# Patient Record
Sex: Male | Born: 1980 | Hispanic: No | Marital: Married | Smoking: Never smoker
Health system: Southern US, Community
[De-identification: ages and names within clinical notes are randomized; demographics above are authoritative.]

---

## 2000-06-14 ENCOUNTER — Encounter: Payer: Self-pay | Admitting: Emergency Medicine

## 2000-06-14 ENCOUNTER — Emergency Department (HOSPITAL_COMMUNITY): Admission: EM | Admit: 2000-06-14 | Discharge: 2000-06-14 | Payer: Self-pay | Admitting: Emergency Medicine

## 2005-05-29 ENCOUNTER — Encounter: Admission: RE | Admit: 2005-05-29 | Discharge: 2005-05-29 | Payer: Self-pay | Admitting: Family Medicine

## 2013-09-02 ENCOUNTER — Emergency Department (HOSPITAL_BASED_OUTPATIENT_CLINIC_OR_DEPARTMENT_OTHER)
Admission: EM | Admit: 2013-09-02 | Discharge: 2013-09-02 | Disposition: A | Discharge status: Home / Self Care | Payer: BC Managed Care – PPO | Attending: Emergency Medicine | Admitting: Emergency Medicine

## 2013-09-02 ENCOUNTER — Encounter (HOSPITAL_BASED_OUTPATIENT_CLINIC_OR_DEPARTMENT_OTHER): Payer: Self-pay | Admitting: Emergency Medicine

## 2013-09-02 DIAGNOSIS — R5381 Other malaise: Secondary | ICD-10-CM | POA: Insufficient documentation

## 2013-09-02 DIAGNOSIS — R5383 Other fatigue: Secondary | ICD-10-CM

## 2013-09-02 DIAGNOSIS — R0602 Shortness of breath: Secondary | ICD-10-CM | POA: Insufficient documentation

## 2013-09-02 DIAGNOSIS — R51 Headache: Secondary | ICD-10-CM | POA: Insufficient documentation

## 2013-09-02 LAB — COMPREHENSIVE METABOLIC PANEL
ALT: 21 U/L (ref 0–53)
AST: 23 U/L (ref 0–37)
Albumin: 4.3 g/dL (ref 3.5–5.2)
Alkaline Phosphatase: 55 U/L (ref 39–117)
BUN: 15 mg/dL (ref 6–23)
CO2: 24 mEq/L (ref 19–32)
Calcium: 9.5 mg/dL (ref 8.4–10.5)
Chloride: 103 mEq/L (ref 96–112)
Creatinine, Ser: 1.1 mg/dL (ref 0.50–1.35)
GFR calc Af Amer: >90 mL/min (ref >90)
GFR calc non Af Amer: 86 mL/min — ABNORMAL LOW (ref >90)
Glucose, Bld: 100 mg/dL — ABNORMAL HIGH (ref 70–99)
Potassium: 4 mEq/L (ref 3.7–5.3)
Sodium: 141 mEq/L (ref 137–147)
Total Bilirubin: 1 mg/dL (ref 0.3–1.2)
Total Protein: 7.1 g/dL (ref 6.0–8.3)

## 2013-09-02 LAB — CBC WITH DIFFERENTIAL/PLATELET
Basophils Absolute: 0.1 10*3/uL (ref 0.0–0.1)
Basophils Relative: 1 % (ref 0–1)
Eosinophils Absolute: 0.3 10*3/uL (ref 0.0–0.7)
Eosinophils Relative: 5 % (ref 0–5)
HCT: 43.4 % (ref 39.0–52.0)
Hemoglobin: 15.3 g/dL (ref 13.0–17.0)
Lymphocytes Relative: 29 % (ref 12–46)
Lymphs Abs: 1.6 10*3/uL (ref 0.7–4.0)
MCH: 29.9 pg (ref 26.0–34.0)
MCHC: 35.3 g/dL (ref 30.0–36.0)
MCV: 84.8 fL (ref 78.0–100.0)
Monocytes Absolute: 0.5 10*3/uL (ref 0.1–1.0)
Monocytes Relative: 9 % (ref 3–12)
Neutro Abs: 3.2 10*3/uL (ref 1.7–7.7)
Neutrophils Relative %: 57 % (ref 43–77)
Platelets: 184 10*3/uL (ref 150–400)
RBC: 5.12 MIL/uL (ref 4.22–5.81)
RDW: 13.4 % (ref 11.5–15.5)
WBC: 5.6 10*3/uL (ref 4.0–10.5)

## 2013-09-02 LAB — TSH: TSH: 2.4 u[IU]/mL (ref 0.350–4.500)

## 2013-09-02 LAB — TROPONIN I: Troponin I: <0.3 ng/mL (ref <0.30)

## 2013-09-02 MED ORDER — DOXYCYCLINE HYCLATE 100 MG PO CAPS: 100.0000 mg | ORAL_CAPSULE | Freq: Two times a day (BID) | ORAL | Status: DC

## 2013-09-02 NOTE — ED Notes (Signed)
Pt c/o of nausea since yesterday with generalized fatigue. Pt reports finding tick on him last thurs.

## 2013-09-02 NOTE — ED Provider Notes (Signed)
CSN: [id number]     Arrival date & time 09/02/13  1903 History  This chart was scribed for Perry Lyonsouglas Delo, MD by Nicholos Johnsenise Iheanachor, ED scribe. This patient was seen in room MH06/MH06 and the patient's care was started at 7:49 PM.      Chief Complaint  Patient presents with  . Nausea   The history is provided by the patient. No language interpreter was used.   HPI Comments: Perry Park is a 33 y.o. male who presents to the Emergency Department complaining of nausea; onset 2 days ago. Sensation to vomit with heavy exertion. States he works out daily but was unable to yesterday due to nausea. Workouts consist of heavy cardio. Also reports some generalized weakness and fatigue, mild HA, intermittent chills, and SOB. Reports pulling a tick off of him 6 days ago. [place] and states he could have got it while walking his dog. 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 Denies fever, rash, chest pain, cough, diarrhea, or swollen lymph nodes.  History reviewed. No pertinent past medical history. History reviewed. No pertinent past surgical history. No family history on file. History  Substance Use Topics  . Smoking status: Never Smoker   . Smokeless tobacco: Not on file  . Alcohol Use: Yes    Review of Systems  Constitutional: Positive for fatigue. Negative for fever and chills.  Respiratory: Positive for shortness of breath. Negative for cough.   Cardiovascular: Negative for chest pain.  Gastrointestinal: Negative for diarrhea.  Skin: Negative for rash.  Neurological: Positive for weakness and headaches.  All other systems reviewed and are negative.  Allergies  Review of patient's allergies indicates no known allergies.  Home Medications   Prior to Admission medications   Not on File   Triage vitals: BP 113/70  Pulse 74  Temp(Src) 98.1 F (36.7 C) (Oral)  Resp 18  Ht 5\' 10"  (1.778 m)  Wt 155 lb (70.308 kg)  BMI 22.24 kg/m2  SpO2 100%  Physical Exam   Nursing note and vitals reviewed. Constitutional: He is oriented to person, place, and time. He appears well-developed and well-nourished. No distress.  HENT:  Head: Normocephalic and atraumatic.  Mouth/Throat: Oropharynx is clear and moist.  Eyes: Conjunctivae and EOM are normal.  Neck: Neck supple. No tracheal deviation present.  Cardiovascular: Normal rate, regular rhythm and normal heart sounds.   Pulmonary/Chest: Effort normal and breath sounds normal. No respiratory distress.  Abdominal: Soft. There is no tenderness.  Musculoskeletal: Normal range of motion.  Lymphadenopathy:    He has no cervical adenopathy.  Neurological: He is alert and oriented to person, place, and time.  Skin: Skin is warm and dry.  Psychiatric: He has a normal mood and affect. His behavior is normal.    ED Course  Procedures (including critical care time) DIAGNOSTIC STUDIES: Oxygen Saturation is 100% on room air, normal by my interpretation.    COORDINATION OF CARE: At 8:01 PM: Discussed treatment plan with patient which includes blood and lab work. Patient agrees.   Labs Review Labs Reviewed - No data to display  Imaging Review No results found.   EKG Interpretation None      MDM   Final diagnoses:  None   Patient presents with weakness starting yesterday morning. He states he was bitten by a tic which he removed 1 week ago. He denies any fevers or chills. He has had some headache but denies stiff neck. His physical exam is unremarkable and  he appears clinically well. Laboratory studies are unremarkable. I suspect either a viral etiology, and less likely an illness related to the tick bite. I discussed this with him and have decided I will prescribe doxycycline. He will hold this prescription for the next 24-48 hours. He is not feeling better he will start the medication. He understands to return if his symptoms substantially worsen or change.  I personally performed the services described  in this documentation, which was scribed in my presence. The recorded information has been reviewed and is accurate.     Perry Lyonsouglas Delo, MD 09/02/13 2107

## 2013-09-02 NOTE — Discharge Instructions (Signed)
Drink plenty of fluids and get plenty of rest.  Doxycycline: Start this medication if you become febrile or symptoms do not improve in the next 24-48 hours.  Return to the emergency department if your symptoms substantially worsen or change.   Fatigue Fatigue is a feeling of tiredness, lack of energy, lack of motivation, or feeling tired all the time. Having enough rest, good nutrition, and reducing stress will normally reduce fatigue. Consult your caregiver if it persists. The nature of your fatigue will help your caregiver to find out its cause. The treatment is based on the cause.  CAUSES  There are many causes for fatigue. Most of the time, fatigue can be traced to one or more of your habits or routines. Most causes fit into one or more of three general areas. They are: Lifestyle problems  Sleep disturbances.  Overwork.  Physical exertion.  Unhealthy habits.  Poor eating habits or eating disorders.  Alcohol and/or drug use .  Lack of proper nutrition (malnutrition). Psychological problems  Stress and/or anxiety problems.  Depression.  Grief.  Boredom. Medical Problems or Conditions  Anemia.  Pregnancy.  Thyroid gland problems.  Recovery from major surgery.  Continuous pain.  Emphysema or asthma that is not well controlled  Allergic conditions.  Diabetes.  Infections (such as mononucleosis).  Obesity.  Sleep disorders, such as sleep apnea.  Heart failure or other heart-related problems.  Cancer.  Kidney disease.  Liver disease.  Effects of certain medicines such as antihistamines, cough and cold remedies, prescription pain medicines, heart and blood pressure medicines, drugs used for treatment of cancer, and some antidepressants. SYMPTOMS  The symptoms of fatigue include:   Lack of energy.  Lack of drive (motivation).  Drowsiness.  Feeling of indifference to the surroundings. DIAGNOSIS  The details of how you feel help guide your  caregiver in finding out what is causing the fatigue. You will be asked about your present and past health condition. It is important to review all medicines that you take, including prescription and non-prescription items. A thorough exam will be done. You will be questioned about your feelings, habits, and normal lifestyle. Your caregiver may suggest blood tests, urine tests, or other tests to look for common medical causes of fatigue.  TREATMENT  Fatigue is treated by correcting the underlying cause. For example, if you have continuous pain or depression, treating these causes will improve how you feel. Similarly, adjusting the dose of certain medicines will help in reducing fatigue.  HOME CARE INSTRUCTIONS   Try to get the required amount of good sleep every night.  Eat a healthy and nutritious diet, and drink enough water throughout the day.  Practice ways of relaxing (including yoga or meditation).  Exercise regularly.  Make plans to change situations that cause stress. Act on those plans so that stresses decrease over time. Keep your work and personal routine reasonable.  Avoid street drugs and minimize use of alcohol.  Start taking a daily multivitamin after consulting your caregiver. SEEK MEDICAL CARE IF:   You have persistent tiredness, which cannot be accounted for.  You have fever.  You have unintentional weight loss.  You have headaches.  You have disturbed sleep throughout the night.  You are feeling sad.  You have constipation.  You have dry skin.  You have gained weight.  You are taking any new or different medicines that you suspect are causing fatigue.  You are unable to sleep at night.  You develop any unusual swelling of  your legs or other parts of your body. SEEK IMMEDIATE MEDICAL CARE IF:   You are feeling confused.  Your vision is blurred.  You feel faint or pass out.  You develop severe headache.  You develop severe abdominal, pelvic, or  back pain.  You develop chest pain, shortness of breath, or an irregular or fast heartbeat.  You are unable to pass a normal amount of urine.  You develop abnormal bleeding such as bleeding from the rectum or you vomit blood.  You have thoughts about harming yourself or committing suicide.  You are worried that you might harm someone else. MAKE SURE YOU:   Understand these instructions.  Will watch your condition.  Will get help right away if you are not doing well or get worse. Document Released: 12/31/2006 Document Revised: 05/28/2011 Document Reviewed: 12/31/2006 [facility] Patient Information 2015 [employer] [place]. This information is not intended to replace advice given to you by your health care provider. Make sure you discuss any questions you have with your health care provider.

## 2015-12-12 ENCOUNTER — Emergency Department (HOSPITAL_BASED_OUTPATIENT_CLINIC_OR_DEPARTMENT_OTHER): Payer: BLUE CROSS/BLUE SHIELD

## 2015-12-12 ENCOUNTER — Encounter (HOSPITAL_BASED_OUTPATIENT_CLINIC_OR_DEPARTMENT_OTHER): Payer: Self-pay | Admitting: Emergency Medicine

## 2015-12-12 ENCOUNTER — Emergency Department (HOSPITAL_BASED_OUTPATIENT_CLINIC_OR_DEPARTMENT_OTHER)
Admission: EM | Admit: 2015-12-12 | Discharge: 2015-12-12 | Disposition: A | Discharge status: Home / Self Care | Payer: BLUE CROSS/BLUE SHIELD | Attending: Emergency Medicine | Admitting: Emergency Medicine

## 2015-12-12 DIAGNOSIS — R1031 Right lower quadrant pain: Secondary | ICD-10-CM

## 2015-12-12 LAB — CBC WITH DIFFERENTIAL/PLATELET
Basophils Absolute: 0.1 10*3/uL (ref 0.0–0.1)
Basophils Relative: 1 %
Eosinophils Absolute: 0.2 10*3/uL (ref 0.0–0.7)
Eosinophils Relative: 3 %
HCT: 43.9 % (ref 39.0–52.0)
Hemoglobin: 15.2 g/dL (ref 13.0–17.0)
Lymphocytes Relative: 34 %
Lymphs Abs: 2.3 10*3/uL (ref 0.7–4.0)
MCH: 29.1 pg (ref 26.0–34.0)
MCHC: 34.6 g/dL (ref 30.0–36.0)
MCV: 83.9 fL (ref 78.0–100.0)
Monocytes Absolute: 0.5 10*3/uL (ref 0.1–1.0)
Monocytes Relative: 8 %
Neutro Abs: 3.7 10*3/uL (ref 1.7–7.7)
Neutrophils Relative %: 54 %
Platelets: 211 10*3/uL (ref 150–400)
RBC: 5.23 MIL/uL (ref 4.22–5.81)
RDW: 13.6 % (ref 11.5–15.5)
WBC: 6.7 10*3/uL (ref 4.0–10.5)

## 2015-12-12 LAB — BASIC METABOLIC PANEL
Anion gap: 9 (ref 5–15)
BUN: 16 mg/dL (ref 6–20)
CO2: 25 mmol/L (ref 22–32)
Calcium: 9.5 mg/dL (ref 8.9–10.3)
Chloride: 104 mmol/L (ref 101–111)
Creatinine, Ser: 1.12 mg/dL (ref 0.61–1.24)
GFR calc Af Amer: >60 mL/min (ref >60)
GFR calc non Af Amer: >60 mL/min (ref >60)
Glucose, Bld: 97 mg/dL (ref 65–99)
Potassium: 4 mmol/L (ref 3.5–5.1)
Sodium: 138 mmol/L (ref 135–145)

## 2015-12-12 LAB — URINALYSIS, ROUTINE W REFLEX MICROSCOPIC
BILIRUBIN URINE: NEGATIVE
Glucose, UA: NEGATIVE mg/dL
Hgb urine dipstick: NEGATIVE
KETONES UR: NEGATIVE mg/dL
Leukocytes, UA: NEGATIVE
NITRITE: NEGATIVE
Protein, ur: NEGATIVE mg/dL
Specific Gravity, Urine: 1.009 (ref 1.005–1.030)
pH: 7 (ref 5.0–8.0)

## 2015-12-12 MED ORDER — IOPAMIDOL (ISOVUE-300) INJECTION 61%
100.0000 mL | Freq: Once | INTRAVENOUS | Status: AC | PRN
  Administered 2015-12-12: 100 mL via INTRAVENOUS

## 2015-12-12 MED ORDER — TRAMADOL HCL 50 MG PO TABS: 50.0000 mg | ORAL_TABLET | Freq: Four times a day (QID) | ORAL | 0 refills | Status: DC | PRN

## 2015-12-12 NOTE — ED Provider Notes (Signed)
[facility] Note   CSN: [id number] Arrival date & time: 12/12/15  1747  By signing my name below, I, Perry Park, attest that this documentation has been prepared under the direction and in the presence of Newell RubbermaidJeffrey Jonique Kulig, PA-C. Electronically Signed: Doreatha MartinEva Park, ED Scribe. 12/12/15. 7:52 PM.    History   Chief Complaint Chief Complaint  Patient presents with  . Abdominal Pain    HPI Perry Park is a 35 y.o. male otherwise healthy on no daily medications who presents to the Emergency Department complaining of moderate, gradually worsening RLQ pain onset 3 days ago and worsened today. Pt also complains of dysuria. Pt states his pain is worsened with movement, ambulation. He reports his pain is slightly alleviated with rest. Pt states his pain is similar to muscle soreness after working out, but more severe and long lasting. No h/o similar pain. Patient states his last BM was this morning. He reports he had multiple BMs today, but denies diarrhea. He denies nausea, vomiting, testicular pain, discolored urine, hematuria.   The history is provided by the patient. No language interpreter was used.    History reviewed. No pertinent past medical history.  There are no active problems to display for this patient.   History reviewed. No pertinent surgical history.     Home Medications    Prior to Admission medications   Medication Sig Start Date End Date Taking? Authorizing Provider  traMADol (ULTRAM) 50 MG tablet Take 1 tablet (50 mg total) by mouth every 6 (six) hours as needed. 12/12/15   Eyvonne MechanicJeffrey Elleanor Guyett, PA-C    Family History No family history on file.  Social History Social History  Substance Use Topics  . Smoking status: Never Smoker  . Smokeless tobacco: Never Used  . Alcohol use Yes     Allergies   Review of patient's allergies indicates no known allergies.   Review of Systems Review of Systems  All other systems reviewed and are  negative.    Physical Exam Updated Vital Signs BP 118/82   Pulse 64   Temp 98.6 F (37 C)   Resp 16   Ht 5\' 10"  (1.778 m)   Wt 76.2 kg   SpO2 99%   BMI 24.11 kg/m   Physical Exam  Constitutional: He appears well-developed and well-nourished.  HENT:  Head: Normocephalic.  Eyes: Conjunctivae are normal.  Cardiovascular: Normal rate.   Pulmonary/Chest: Effort normal. No respiratory distress.  Abdominal: Soft. He exhibits no distension. There is tenderness. There is no rebound and no guarding.  Tenderness to RLQ.  No obvious deformities or abnormalities of the abdominal wall  Musculoskeletal: Normal range of motion.  Neurological: He is alert.  Skin: Skin is warm and dry.  No rash  Psychiatric: He has a normal mood and affect. His behavior is normal.  Nursing note and vitals reviewed.    ED Treatments / Results   DIAGNOSTIC STUDIES: Oxygen Saturation is 99% on RA, normal by my interpretation.    COORDINATION OF CARE: 7:50 PM Discussed treatment plan with pt at bedside which includes UA, CT A/P, lab work and pt agreed to plan.    Labs (all labs ordered are listed, but only abnormal results are displayed) Labs Reviewed  URINALYSIS, ROUTINE W REFLEX MICROSCOPIC (NOT [facility])  CBC WITH DIFFERENTIAL/PLATELET  BASIC METABOLIC PANEL    EKG  EKG Interpretation None       Radiology Ct Abdomen Pelvis W Contrast  Result Date: 12/12/2015 CLINICAL DATA:  Right  lower quadrant pain for 3 days.  Dysuria. EXAM: CT ABDOMEN AND PELVIS WITH CONTRAST TECHNIQUE: Multidetector CT imaging of the abdomen and pelvis was performed using the standard protocol following bolus administration of intravenous contrast. CONTRAST:  ISOVUE-300 IOPAMIDOL (ISOVUE-300) INJECTION 61% COMPARISON:  None. FINDINGS: Lower chest: No acute abnormality. Hepatobiliary: No focal liver abnormality is seen. No gallstones, gallbladder wall thickening, or biliary dilatation. Pancreas: Unremarkable. No  pancreatic ductal dilatation or surrounding inflammatory changes. Spleen: Normal in size without focal abnormality. Adrenals/Urinary Tract: Adrenal glands are unremarkable. Cyst is identified arising from the upper pole of the right kidney measuring 2.8 cm. The left kidney is normal. The urinary bladder is negative Stomach/Bowel: The stomach is within normal limits. The small bowel loops have a normal course and caliber. No obstruction. Normal appearance of the colon. The appendix is visualized and appears normal. Vascular/Lymphatic: No significant vascular findings are present. No enlarged abdominal or pelvic lymph nodes. Reproductive: Prostate is unremarkable. Other: No abdominal wall hernia or abnormality. No abdominopelvic ascites. Musculoskeletal: No acute or significant osseous findings. IMPRESSION: 1. No acute findings identified within the abdomen or pelvis. 2. Right kidney cyst. Electronically Signed   By: Signa Kell M.D.   On: 12/12/2015 22:19    Procedures Procedures (including critical care time)  Medications Ordered in ED Medications  iopamidol (ISOVUE-300) 61 % injection 100 mL (100 mLs Intravenous Contrast Given 12/12/15 2125)     Initial Impression / Assessment and Plan / ED Course  I have reviewed the triage vital signs and the nursing notes.  Pertinent labs & imaging results that were available during my care of the patient were reviewed by me and considered in my medical decision making (see chart for details).  Clinical Course    Labs: UA, BMP, CBC  Imaging: CT A/P with contrast   Consults: None   Therapeutics:   Discharge Meds:   Assessment/Plan: This 35-year-old male presents today with right lower quadrant pain.  Patient had normal CT scan, normal laboratory analysis.  He denies any testicular pain, discharge, really concerning signs or symptoms.  Patient's pain likely muscular in nature based on negative findings on today's workup.,  He has a reassuring workup  here, he will need close follow-up with primary care, strict return precautions were given.  Patient verbalizes understanding and agreement did a splint had no further questions or concerns.       Final Clinical Impressions(s) / ED Diagnoses   Final diagnoses:  Right lower quadrant abdominal pain    New Prescriptions Discharge Medication List as of 12/12/2015 11:10 PM    START taking these medications   Details  traMADol (ULTRAM) 50 MG tablet Take 1 tablet (50 mg total) by mouth every 6 (six) hours as needed., Starting Mon 12/12/2015, Print        I personally performed the services described in this documentation, which was scribed in my presence. The recorded information has been reviewed and is accurate.   Eyvonne Mechanic, PA-C 12/13/15 1610    Tomasita Crumble, MD 12/13/15 0930

## 2015-12-12 NOTE — ED Notes (Signed)
Pt c/o RLQ pain that started on Friday, and became acutely worse today. Pt radiates around to back and into groin. Pt reports worsening of pain with urination.

## 2015-12-12 NOTE — ED Notes (Signed)
Patient transported to CT 

## 2015-12-12 NOTE — Discharge Instructions (Signed)
Please use Tylenol or ibuprofen as needed for pain.  Return to emergency room immediately if he expands any new or worsening signs or symptoms.

## 2016-10-29 ENCOUNTER — Encounter (HOSPITAL_BASED_OUTPATIENT_CLINIC_OR_DEPARTMENT_OTHER): Payer: Self-pay | Admitting: *Deleted

## 2016-10-29 ENCOUNTER — Emergency Department (HOSPITAL_BASED_OUTPATIENT_CLINIC_OR_DEPARTMENT_OTHER)
Admission: EM | Admit: 2016-10-29 | Discharge: 2016-10-29 | Disposition: A | Discharge status: Home / Self Care | Payer: BLUE CROSS/BLUE SHIELD | Attending: Emergency Medicine | Admitting: Emergency Medicine

## 2016-10-29 DIAGNOSIS — M545 Low back pain, unspecified: Secondary | ICD-10-CM

## 2016-10-29 MED ORDER — NAPROXEN 250 MG PO TABS: 250.0000 mg | ORAL_TABLET | Freq: Two times a day (BID) | ORAL | 0 refills | Status: DC

## 2016-10-29 MED ORDER — METHOCARBAMOL 500 MG PO TABS: 500.0000 mg | ORAL_TABLET | Freq: Two times a day (BID) | ORAL | 0 refills | Status: DC | PRN
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
## 2018-02-18 IMAGING — CT CT ABD-PELV W/ CM
2 of 4 series · 16 of 46 positions shown, 18 images · IV contrast (APPLIED)
Comparison: None.

CLINICAL DATA: Right lower quadrant pain for 3 days.  Dysuria.

EXAM:
CT ABDOMEN AND PELVIS WITH CONTRAST
TECHNIQUE: Multidetector CT imaging of the abdomen and pelvis was performed
using the standard protocol following bolus administration of
intravenous contrast.
CONTRAST:  100mL S68KHN-RYY IOPAMIDOL (S68KHN-RYY) INJECTION 61%

[Series 2: axial st · axial · 0.78mm/px · z∈[+758,+1194]mm · 13 of 97 slices shown, 15 images]
[im 5/97  soft-tissue]
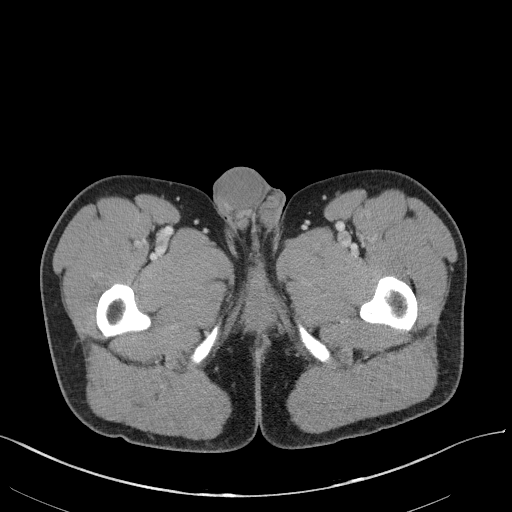
[im 5/97  bone]
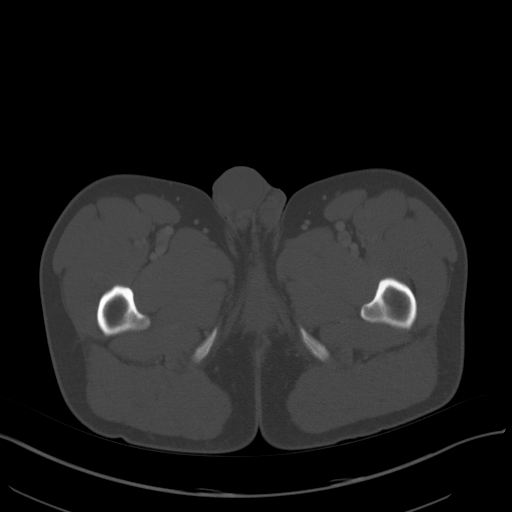
[im 14/97  soft-tissue]
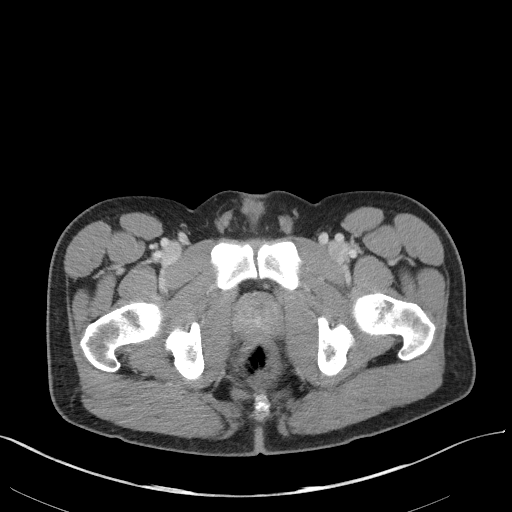
[im 22/97  soft-tissue]
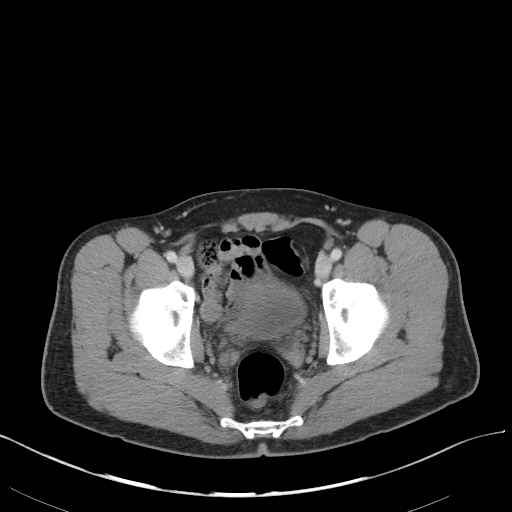
[im 27/97  soft-tissue]
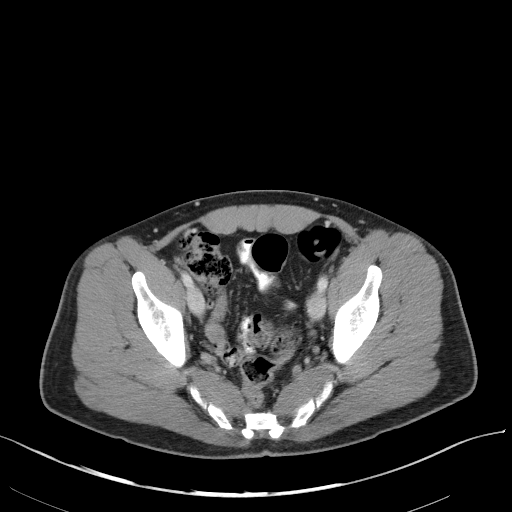
[im 35/97  soft-tissue]
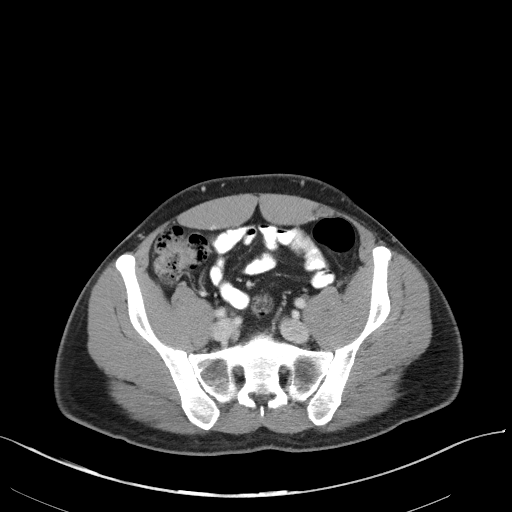
[im 40/97  soft-tissue]
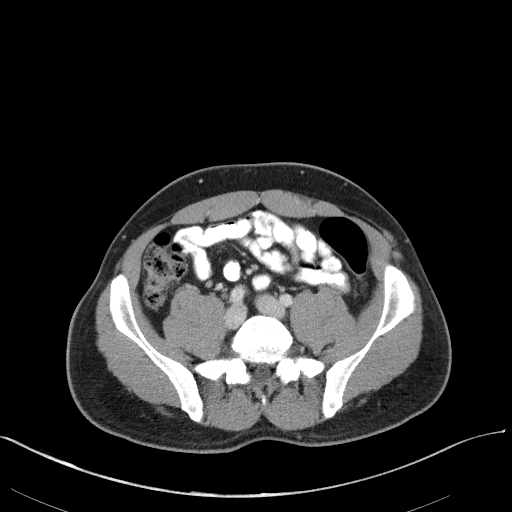
[im 49/97  soft-tissue]
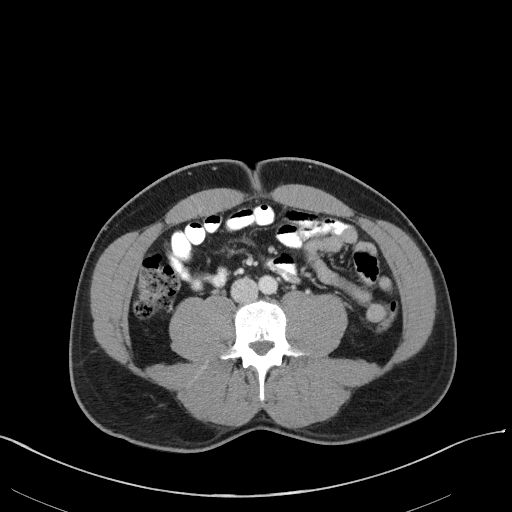
[im 57/97  soft-tissue]
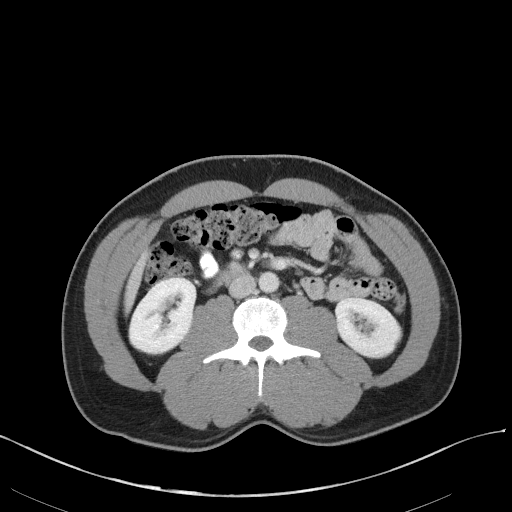
[im 62/97  soft-tissue]
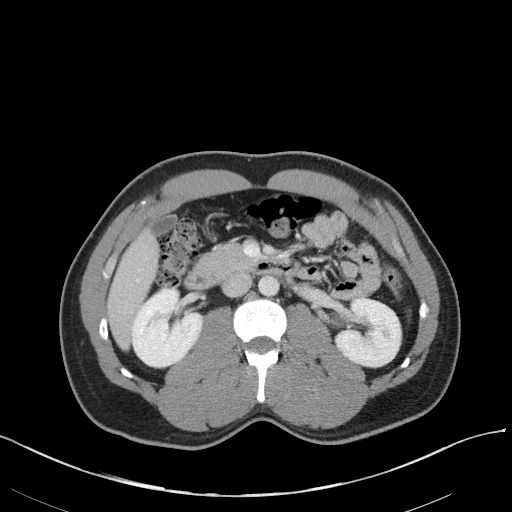
[im 62/97  bone]
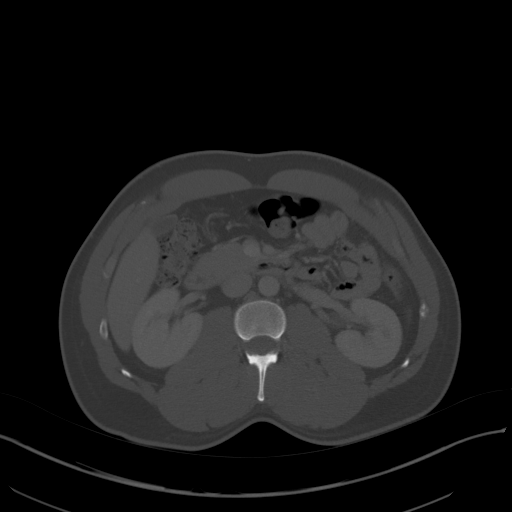
[im 70/97  soft-tissue]
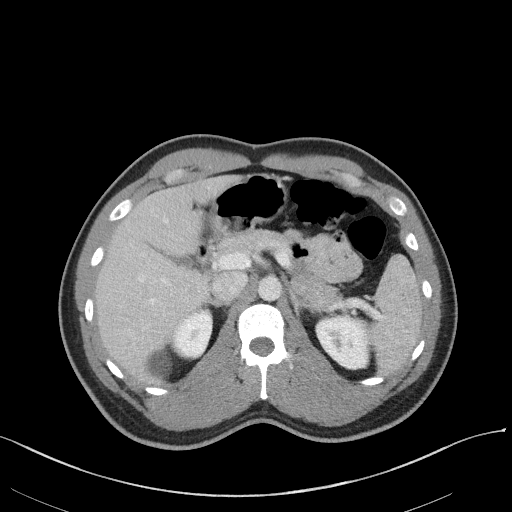
[im 75/97  soft-tissue]
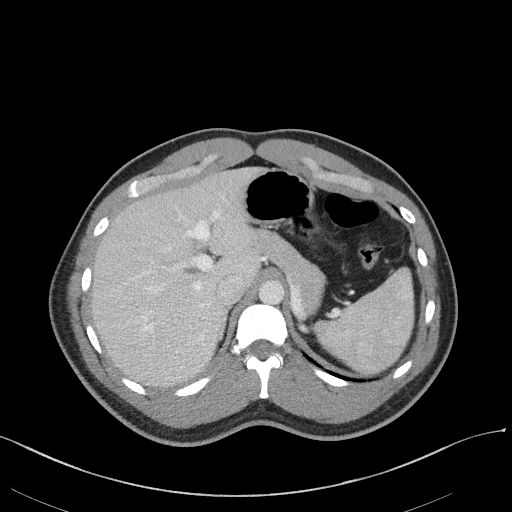
[im 83/97  soft-tissue]
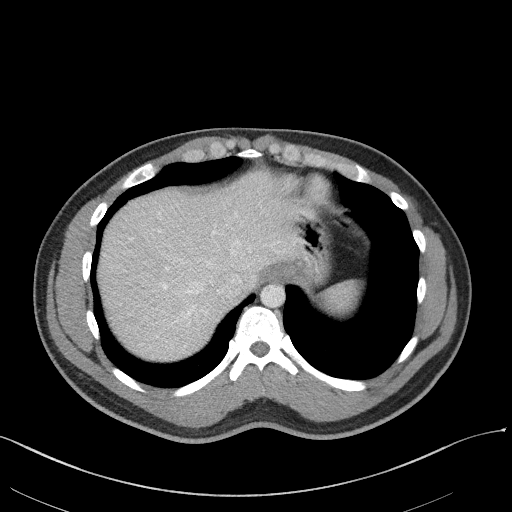
[im 92/97  soft-tissue]
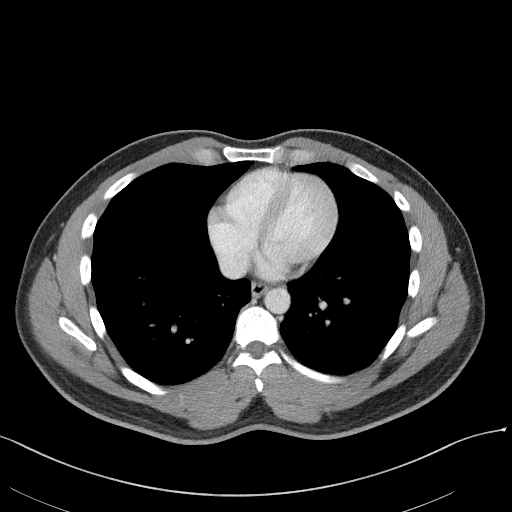

[Series 5: coronal st · coronal · 0.77mm/px · 3 of 76 slices shown]
[im 26/76  soft-tissue]
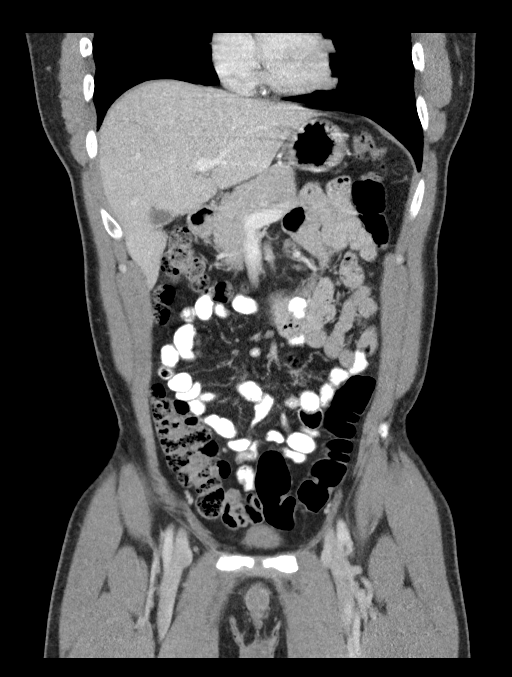
[im 34/76  soft-tissue]
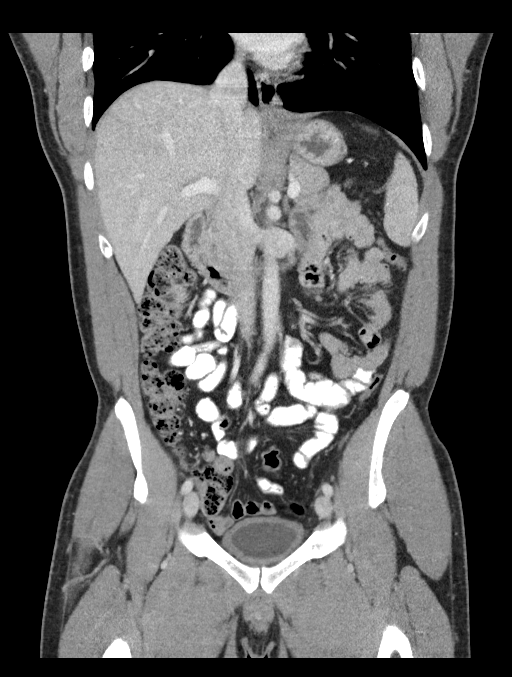
[im 42/76  soft-tissue]
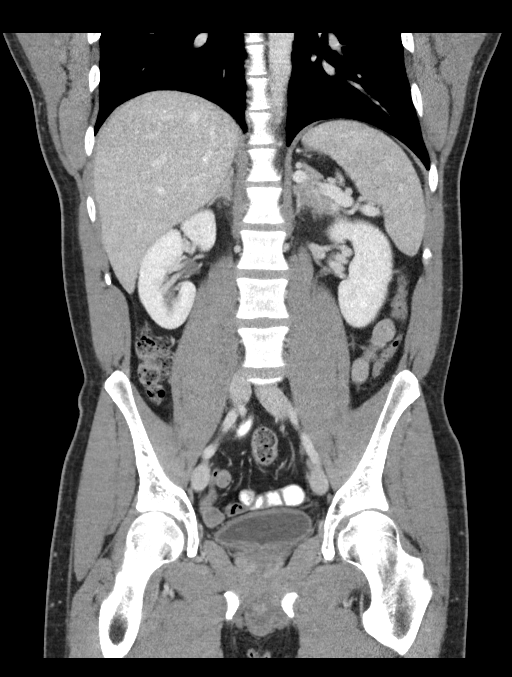

[16 of 46 positions shown; findings below may reference images not displayed]

FINDINGS: Lower chest: No acute abnormality.

Hepatobiliary: No focal liver abnormality is seen. No gallstones,
gallbladder wall thickening, or biliary dilatation.

Pancreas: Unremarkable. No pancreatic ductal dilatation or
surrounding inflammatory changes.

Spleen: Normal in size without focal abnormality.

Adrenals/Urinary Tract: Adrenal glands are unremarkable. Cyst is
identified arising from the upper pole of the right kidney measuring
2.8 cm. The left kidney is normal. The urinary bladder is negative

Stomach/Bowel: The stomach is within normal limits. The small bowel
loops have a normal course and caliber. No obstruction. Normal
appearance of the colon. The appendix is visualized and appears
normal.

Vascular/Lymphatic: No significant vascular findings are present. No
enlarged abdominal or pelvic lymph nodes.

Reproductive: Prostate is unremarkable.

Other: No abdominal wall hernia or abnormality. No abdominopelvic
ascites.

Musculoskeletal: No acute or significant osseous findings.
IMPRESSION: 1. No acute findings identified within the abdomen or pelvis.
2. Right kidney cyst.

## 2019-07-25 ENCOUNTER — Ambulatory Visit: Payer: Self-pay | Attending: Internal Medicine

## 2019-07-25 DIAGNOSIS — Z23 Encounter for immunization: Secondary | ICD-10-CM

## 2019-07-25 NOTE — Progress Notes (Signed)
   Covid-19 Vaccination Clinic  Name:  Perry Park    MRN: [id number] DOB: 1980/06/12  07/25/2019  Perry Park was observed post Covid-19 immunization for 15 minutes without incident. He was provided with Vaccine Information Sheet and instruction to access the V-Safe system.   Perry Park was instructed to call 911 with any severe reactions post vaccine: Marland Kitchen Difficulty breathing  . Swelling of face and throat  . A fast heartbeat  . A bad rash all over body  . Dizziness and weakness   Immunizations Administered    Name Date Dose VIS Date Route   Pfizer COVID-19 Vaccine 07/25/2019 10:10 AM 0.3 mL 05/13/2018 Intramuscular   Manufacturer: [employer], [employer]   Lot: [id number]   NDC: [address]-8

## 2019-08-22 ENCOUNTER — Ambulatory Visit: Payer: Self-pay | Attending: Internal Medicine

## 2019-08-22 DIAGNOSIS — Z23 Encounter for immunization: Secondary | ICD-10-CM

## 2019-08-22 NOTE — Progress Notes (Signed)
° °  Covid-19 Vaccination Clinic  Name:  Perry Park    MRN: [id number] DOB: [birth year]  08/22/2019  [name] was observed post Covid-19 immunization for 15 minutes without incident. He was provided with Vaccine Information Sheet and instruction to access the V-Safe system.   [name] was instructed to call 911 with any severe reactions post vaccine:  Difficulty breathing   Swelling of face and throat   A fast heartbeat   A bad rash all over body   Dizziness and weakness   Immunizations Administered    Name Date Dose VIS Date Route   Pfizer COVID-19 Vaccine 08/22/2019 10:08 AM 0.3 mL 05/13/2018 Intramuscular   Manufacturer: [employer], [employer]   Lot: [id number]   NDC: [address]-6

## 2020-03-14 ENCOUNTER — Encounter (HOSPITAL_BASED_OUTPATIENT_CLINIC_OR_DEPARTMENT_OTHER): Payer: Self-pay

## 2020-03-14 ENCOUNTER — Other Ambulatory Visit: Payer: Self-pay

## 2020-03-14 ENCOUNTER — Emergency Department (HOSPITAL_BASED_OUTPATIENT_CLINIC_OR_DEPARTMENT_OTHER)
Admission: EM | Admit: 2020-03-14 | Discharge: 2020-03-14 | Disposition: A | Discharge status: Home / Self Care | Payer: 59 | Attending: Emergency Medicine | Admitting: Emergency Medicine

## 2020-03-14 DIAGNOSIS — J069 Acute upper respiratory infection, unspecified: Secondary | ICD-10-CM | POA: Insufficient documentation

## 2020-03-14 DIAGNOSIS — Z20822 Contact with and (suspected) exposure to covid-19: Secondary | ICD-10-CM | POA: Insufficient documentation

## 2020-03-14 DIAGNOSIS — R059 Cough, unspecified: Secondary | ICD-10-CM | POA: Diagnosis present

## 2020-03-14 LAB — RESP PANEL BY RT-PCR (FLU A&B, COVID) ARPGX2
Influenza A by PCR: NEGATIVE
Influenza B by PCR: NEGATIVE
SARS Coronavirus 2 by RT PCR: NEGATIVE

## 2020-03-14 LAB — GROUP A STREP BY PCR: Group A Strep by PCR: NOT DETECTED

## 2020-03-14 MED ORDER — BENZONATATE 100 MG PO CAPS: 100.0000 mg | ORAL_CAPSULE | Freq: Three times a day (TID) | ORAL | 0 refills | Status: AC

## 2020-03-14 NOTE — ED Notes (Signed)
ED Provider at bedside. 

## 2020-03-14 NOTE — ED Provider Notes (Signed)
[facility] EMERGENCY DEPARTMENT Provider Note   CSN: [id number] Arrival date & time: 03/14/20  0915     History Chief Complaint  Patient presents with  . Sore Throat    Perry Park is a 39 y.o. male reports is otherwise healthy no daily medication use.  Patient reports 2 days ago he developed rhinorrhea, congestion, mild sore throat and nonproductive cough.  He reports multiple coworkers are sick with similar symptoms but none have tested positive for COVID-19.  Patient reports he is vaccinated against Covid.  He describes sore throat as a mild scratchy sensation bilateral no aggravating or alleviating factors no radiation of pain.  Denies measured fever, denies chest pain/shortness of breath, hemoptysis, abdominal pain, vomiting, diarrhea, extremity swelling/color change, neck stiffness, change to voice, difficulty swallowing or any additional concerns.  HPI     History reviewed. No pertinent past medical history.  There are no problems to display for this patient.   History reviewed. No pertinent surgical history.     No family history on file.  Social History   Tobacco Use  . Smoking status: Never Smoker  . Smokeless tobacco: Never Used  Substance Use Topics  . Alcohol use: Yes  . Drug use: No    Home Medications Prior to Admission medications   Medication Sig Start Date End Date Taking? Authorizing Provider  benzonatate (TESSALON) 100 MG capsule Take 1 capsule (100 mg total) by mouth every 8 (eight) hours. 03/14/20  Yes Harlene Salts A, PA-C    Allergies    Patient has no known allergies.  Review of Systems   Review of Systems  Constitutional: Negative.  Negative for chills and fever.  HENT: Positive for rhinorrhea and sore throat. Negative for facial swelling, trouble swallowing and voice change.   Eyes: Negative.  Negative for visual disturbance.  Respiratory: Positive for cough. Negative for shortness of breath.   Cardiovascular:  Negative.  Negative for chest pain and leg swelling.  Gastrointestinal: Negative.  Negative for abdominal pain, diarrhea and vomiting.  Musculoskeletal: Positive for myalgias. Negative for neck stiffness.  Neurological: Negative.  Negative for weakness and headaches.    Physical Exam Updated Vital Signs BP 131/83 (BP Location: Left Arm)   Pulse 94   Temp 98.2 F (36.8 C) (Oral) Comment: pt did take 400 mg ibuprofen PTA.  Resp 18   Ht 5\' 10"  (1.778 m)   Wt 77.1 kg   SpO2 100%   BMI 24.39 kg/m   Physical Exam Constitutional:      General: He is not in acute distress.    Appearance: Normal appearance. He is well-developed. He is not ill-appearing or diaphoretic.  HENT:     Head: Normocephalic and atraumatic.     Jaw: There is normal jaw occlusion.     Comments: Mild posterior pharynx cobblestoning and postnasal drip. The patient has normal phonation and is in control of secretions. No stridor.  Midline uvula without edema. Soft palate rises symmetrically. No tonsillar erythema, swelling or exudates. Tongue protrusion is normal, floor of mouth is soft. No trismus. No creptius on neck palpation. No gingival erythema or fluctuance noted. Mucus membranes moist. No pallor noted.    Right Ear: Tympanic membrane and ear canal normal.     Left Ear: Tympanic membrane and ear canal normal.  Eyes:     General: Vision grossly intact. Gaze aligned appropriately.     Pupils: Pupils are equal, round, and reactive to light.  Neck:     Trachea:  Trachea and phonation normal. No tracheal tenderness or tracheal deviation.     Meningeal: Brudzinski's sign absent.  Cardiovascular:     Rate and Rhythm: Normal rate and regular rhythm.  Pulmonary:     Effort: Pulmonary effort is normal. No respiratory distress.     Breath sounds: Normal breath sounds.  Abdominal:     General: There is no distension.     Palpations: Abdomen is soft.     Tenderness: There is no abdominal tenderness. There is no  guarding or rebound.  Musculoskeletal:        General: Normal range of motion.     Cervical back: Normal range of motion and neck supple. No edema, rigidity or crepitus.     Right lower leg: No edema.     Left lower leg: No edema.  Skin:    General: Skin is warm and dry.  Neurological:     Mental Status: He is alert.     GCS: GCS eye subscore is 4. GCS verbal subscore is 5. GCS motor subscore is 6.     Comments: Speech is clear and goal oriented, follows commands Major Cranial nerves without deficit, no facial droop Moves extremities without ataxia, coordination intact  Psychiatric:        Behavior: Behavior normal.     ED Results / Procedures / Treatments   Labs (all labs ordered are listed, but only abnormal results are displayed) Labs Reviewed  RESP PANEL BY RT-PCR (FLU A&B, COVID) [id number]  GROUP A STREP BY PCR    EKG None  Radiology No results found.  Procedures Procedures (including critical care time)  Medications Ordered in ED Medications - No data to display  ED Course  I have reviewed the triage vital signs and the nursing notes.  Pertinent labs & imaging results that were available during my care of the patient were reviewed by me and considered in my medical decision making (see chart for details).    MDM Rules/Calculators/A&P                         Additional history obtained from: 1. Nursing notes from this visit. 2. Review of electronic medical records.  No pertinent recent ER visits  Perry Park was evaluated in Emergency Department on 03/14/2020 for the symptoms described in the history of present illness. He was evaluated in the context of the global COVID-19 pandemic, which necessitated consideration that the patient might be at risk for infection with the SARS-CoV-2 virus that causes COVID-19. Institutional protocols and algorithms that pertain to the evaluation of patients at risk for COVID-19 are in a state of rapid change based on information  released by regulatory bodies including the CDC and federal and state organizations. These policies and algorithms were followed during the patient's care in the ED.  39 year old otherwise healthy male arrived with 2 days of symptoms of viral URI.  On exam he is well-appearing no acute distress vital signs stable on room air.  Cranial nerves intact, no meningeal signs, airway shows mild cobblestoning posterior nasal drip without evidence of PTA, RPA, Ludewig's, sinusitis, dental abscess or other deep space infections of the head or neck.  Cardiopulmonary exam is unremarkable.  Abdomen soft nontender without peritoneal signs.  Neurovascular tact to all 4 extremities without evidence of DVT.  Covid/influenza panel negative.  Strep test negative.  Suspect patient with mild viral URI.  There is no indication for antibiotics, chest x-ray or further work-up  at this point.  Patient does not meet SIRS/sepsis criteria and is nontoxic in appearance.  Discussed with patient that he should maintain water intake to avoid dehydration and get plenty of rest and social distance to avoid spread of unspecified viral illness.  Tessalon prescribed to help with cough.  At this time there does not appear to be any evidence of an acute emergency medical condition and the patient appears stable for discharge with appropriate outpatient follow up. Diagnosis was discussed with patient who verbalizes understanding of care plan and is agreeable to discharge. I have discussed return precautions with patient who verbalizes understanding. Patient encouraged to follow-up with their PCP. All questions answered.   Note: Portions of this report may have been transcribed using voice recognition software. Every effort was made to ensure accuracy; however, inadvertent computerized transcription errors may still be present. Final Clinical Impression(s) / ED Diagnoses Final diagnoses:  Viral URI with cough    Rx / DC Orders ED Discharge  Orders         Ordered    benzonatate (TESSALON) 100 MG capsule  Every 8 hours        03/14/20 1248           Elizabeth Palau 03/14/20 1248    Milagros Loll, MD 03/17/20 2340

## 2020-03-14 NOTE — Discharge Instructions (Signed)
At this time there does not appear to be the presence of an emergent medical condition, however there is always the potential for conditions to change. Please read and follow the below instructions.  Please return to the Emergency Department immediately for any new or worsening symptoms. Please be sure to follow up with your Primary Care Provider within one week regarding your visit today; please call their office to schedule an appointment even if you are feeling better for a follow-up visit. Please drink plenty water to avoid dehydration and get plenty of rest.  You may use the medication Tessalon as prescribed to help with your cough.  Go to the nearest Emergency Department immediately if: You have fever or chills You have chest pain or trouble breathing. You have very bad or constant: Headache. Ear pain. Pain in your forehead, behind your eyes, and over your cheekbones (sinus pain). Chest pain. You have long-lasting (chronic) lung disease along with any of these: Wheezing. Long-lasting cough. Coughing up blood. A change in your usual mucus. You have a stiff neck. You have changes in your: Vision. Hearing. Thinking. Mood. You have any new/concerning or worsening of symptoms   Please read the additional information packets attached to your discharge summary.  Do not take your medicine if  develop an itchy rash, swelling in your mouth or lips, or difficulty breathing; call 911 and seek immediate emergency medical attention if this occurs.  You may review your lab tests and imaging results in their entirety on your MyChart account.  Please discuss all results of fully with your primary care provider and other specialist at your follow-up visit.  Note: Portions of this text may have been transcribed using voice recognition software. Every effort was made to ensure accuracy; however, inadvertent computerized transcription errors may still be present.

## 2020-03-14 NOTE — ED Triage Notes (Signed)
Pt arrives with c/o sore throat X2 days, did take Zyrtec, reports seasonal allergies, is vaccinated for Covid.
